# Patient Record
Sex: Male | Born: 1985 | Race: White | Hispanic: No | Marital: Single | Smoking: Never smoker
Health system: Southern US, Community
[De-identification: ages and names within clinical notes are randomized; demographics above are authoritative.]

## PROBLEM LIST (undated history)

## (undated) DIAGNOSIS — I10 Essential (primary) hypertension: Secondary | ICD-10-CM

---

## 2017-03-20 ENCOUNTER — Ambulatory Visit: Payer: Managed Care, Other (non HMO) | Admitting: Podiatry

## 2017-03-30 ENCOUNTER — Encounter: Payer: Self-pay | Admitting: Podiatry

## 2017-03-30 ENCOUNTER — Ambulatory Visit (INDEPENDENT_AMBULATORY_CARE_PROVIDER_SITE_OTHER): Payer: Managed Care, Other (non HMO) | Admitting: Podiatry

## 2017-03-30 VITALS — BP 151/91 | HR 78

## 2017-03-30 DIAGNOSIS — L600 Ingrowing nail: Secondary | ICD-10-CM | POA: Diagnosis not present

## 2017-03-30 DIAGNOSIS — L03032 Cellulitis of left toe: Secondary | ICD-10-CM | POA: Diagnosis not present

## 2017-03-30 MED ORDER — CEPHALEXIN 500 MG PO CAPS: 500.0000 mg | ORAL_CAPSULE | Freq: Two times a day (BID) | ORAL | 0 refills | Status: AC

## 2017-03-30 NOTE — Progress Notes (Signed)
   Subjective:    Patient ID: Derek Lewis, male    DOB: [birth year], 31 y.o.   MRN: [id number]  HPIThis patient presents to the office with chief complain of an ingrown toenail left foot big toe.  He says he has history of ingrown toenail trated by PCP three times over three years.  He says on his last visit he was referred to my office for permanent correction and killing the root.  He says the ingrown nail is painful walking and wearing his shoes.    Review of Systems  All other systems reviewed and are negative.      Objective:   Physical Exam GENERAL APPEARANCE: Alert, conversant. Appropriately groomed. No acute distress.  VASCULAR: Pedal pulses are  palpable at  [facility] and PT bilateral.  Capillary refill time is immediate to all digits,  Normal temperature gradient.  Digital hair growth is present bilateral  NEUROLOGIC: sensation is normal to 5.07 monofilament at 5/5 sites bilateral.  Light touch is intact bilateral, Muscle strength normal.  MUSCULOSKELETAL: acceptable muscle strength, tone and stability bilateral.  Intrinsic muscluature intact bilateral.  Rectus appearance of foot and digits noted bilateral.   DERMATOLOGIC: skin color, texture, and turgor are within normal limits.  No preulcerative lesions or ulcers  are seen, no interdigital maceration noted.  No open lesions present.   No drainage noted.  NAILS  Marked incurvation lateral border left great toe.  Paronychia with granulation tissue lateral border left great toe.         Assessment & Plan:  Paronychia lateral border left great toe.  Ingrown nail  Left hallux.   IE.  Nail surgery.  Treatment options and alternatives discussed.  Recommended permanent phenol matrixectomy and patient agreed.  Left hallux  was prepped with alcohol and a toe block of 3cc of 2% lidocaine plain was administered in a digital toe block. .  The toe was then prepped with betadine solution .  The offending nail border was then excised and matrix  tissue exposed.  Phenol was then applied to the matrix tissue followed by an alcohol wash.  Antibiotic ointment and a dry sterile dressing was applied.  The patient was dispensed instructions for aftercare.  Prescribe Cephalexin 500 mg  # 15.  RTC 1 week.         Helane Gunther DPM

## 2017-04-06 ENCOUNTER — Encounter: Payer: Self-pay | Admitting: Podiatry

## 2017-04-06 ENCOUNTER — Ambulatory Visit (INDEPENDENT_AMBULATORY_CARE_PROVIDER_SITE_OTHER): Payer: Managed Care, Other (non HMO) | Admitting: Podiatry

## 2017-04-06 DIAGNOSIS — Z09 Encounter for follow-up examination after completed treatment for conditions other than malignant neoplasm: Secondary | ICD-10-CM

## 2017-04-06 NOTE — Progress Notes (Signed)
This patient returns to the office following nail surgery one week ago.  The patient says toe has been soaked and bandaged as directed.  There has been improvement of the toe since the surgery has been performed.  There is different pain noted at the surgical site. The patient presents for continued evaluation and treatment.  GENERAL APPEARANCE: Alert, conversant. Appropriately groomed. No acute distress.  VASCULAR: Pedal pulses palpable at  [facility] and PT bilateral.  Capillary refill time is immediate to all digits,  Normal temperature gradient.    NEUROLOGIC: sensation is normal to 5.07 monofilament at 5/5 sites bilateral.  Light touch is intact bilateral, Muscle strength normal.  MUSCULOSKELETAL: acceptable muscle strength, tone and stability bilateral.  Intrinsic muscluature intact bilateral.  Rectus appearance of foot and digits noted bilateral.   DERMATOLOGIC: skin color, texture, and turgor are within normal limits.  No preulcerative lesions or ulcers  are seen, no interdigital maceration noted.   NAILS  There is necrotic tissue along the nail groove  In the absence of redness swelling and pain. Blister noted laterally at the surgical site.  DX  S/p nail surgery  Phenol reaction.  ROV  Home instructions were discussed.  Patient to call the office if there are any questions or concerns.   Helane Gunther DPM

## 2020-10-03 ENCOUNTER — Emergency Department
Admission: EM | Admit: 2020-10-03 | Discharge: 2020-10-03 | Disposition: A | Discharge status: Home / Self Care | Payer: Managed Care, Other (non HMO) | Attending: Emergency Medicine | Admitting: Emergency Medicine

## 2020-10-03 ENCOUNTER — Other Ambulatory Visit: Payer: Self-pay

## 2020-10-03 ENCOUNTER — Encounter: Payer: Self-pay | Admitting: Emergency Medicine

## 2020-10-03 ENCOUNTER — Emergency Department: Payer: Managed Care, Other (non HMO)

## 2020-10-03 DIAGNOSIS — R197 Diarrhea, unspecified: Secondary | ICD-10-CM

## 2020-10-03 DIAGNOSIS — I10 Essential (primary) hypertension: Secondary | ICD-10-CM | POA: Diagnosis not present

## 2020-10-03 DIAGNOSIS — K529 Noninfective gastroenteritis and colitis, unspecified: Secondary | ICD-10-CM

## 2020-10-03 HISTORY — DX: Essential (primary) hypertension: I10

## 2020-10-03 LAB — COMPREHENSIVE METABOLIC PANEL
ALT: 56 U/L — ABNORMAL HIGH (ref 0–44)
AST: 27 U/L (ref 15–41)
Albumin: 3.5 g/dL (ref 3.5–5.0)
Alkaline Phosphatase: 112 U/L (ref 38–126)
Anion gap: 15 (ref 5–15)
BUN: 12 mg/dL (ref 6–20)
CO2: 19 mmol/L — ABNORMAL LOW (ref 22–32)
Calcium: 8.6 mg/dL — ABNORMAL LOW (ref 8.9–10.3)
Chloride: 97 mmol/L — ABNORMAL LOW (ref 98–111)
Creatinine, Ser: 1.24 mg/dL (ref 0.61–1.24)
GFR, Estimated: >60 mL/min (ref >60)
Glucose, Bld: 137 mg/dL — ABNORMAL HIGH (ref 70–99)
Potassium: 3.8 mmol/L (ref 3.5–5.1)
Sodium: 131 mmol/L — ABNORMAL LOW (ref 135–145)
Total Bilirubin: 1.5 mg/dL — ABNORMAL HIGH (ref 0.3–1.2)
Total Protein: 7.7 g/dL (ref 6.5–8.1)

## 2020-10-03 LAB — CBC WITH DIFFERENTIAL/PLATELET
Abs Immature Granulocytes: 0.18 10*3/uL — ABNORMAL HIGH (ref 0.00–0.07)
Basophils Absolute: 0.1 10*3/uL (ref 0.0–0.1)
Basophils Relative: 1 %
Eosinophils Absolute: 0.1 10*3/uL (ref 0.0–0.5)
Eosinophils Relative: 1 %
HCT: 38.8 % — ABNORMAL LOW (ref 39.0–52.0)
Hemoglobin: 13.4 g/dL (ref 13.0–17.0)
Immature Granulocytes: 1 %
Lymphocytes Relative: 13 %
Lymphs Abs: 1.6 10*3/uL (ref 0.7–4.0)
MCH: 29.5 pg (ref 26.0–34.0)
MCHC: 34.5 g/dL (ref 30.0–36.0)
MCV: 85.5 fL (ref 80.0–100.0)
Monocytes Absolute: 1.2 10*3/uL — ABNORMAL HIGH (ref 0.1–1.0)
Monocytes Relative: 9 %
Neutro Abs: 9.6 10*3/uL — ABNORMAL HIGH (ref 1.7–7.7)
Neutrophils Relative %: 75 %
Platelets: 299 10*3/uL (ref 150–400)
RBC: 4.54 MIL/uL (ref 4.22–5.81)
RDW: 11.8 % (ref 11.5–15.5)
WBC: 12.7 10*3/uL — ABNORMAL HIGH (ref 4.0–10.5)
nRBC: 0 % (ref 0.0–0.2)

## 2020-10-03 LAB — LIPASE, BLOOD: Lipase: 25 U/L (ref 11–51)

## 2020-10-03 MED ORDER — IOHEXOL 300 MG/ML  SOLN
125.0000 mL | Freq: Once | INTRAMUSCULAR | Status: AC | PRN
  Administered 2020-10-03: 125 mL via INTRAVENOUS

## 2020-10-03 MED ORDER — LACTATED RINGERS IV BOLUS
1000.0000 mL | Freq: Once | INTRAVENOUS | Status: AC
  Administered 2020-10-03: 1000 mL via INTRAVENOUS

## 2020-10-03 MED ORDER — ONDANSETRON 4 MG PO TBDP: 4.0000 mg | ORAL_TABLET | Freq: Three times a day (TID) | ORAL | 0 refills | Status: AC | PRN

## 2020-10-03 MED ORDER — MORPHINE SULFATE (PF) 4 MG/ML IV SOLN
4.0000 mg | Freq: Once | INTRAVENOUS | Status: AC
  Administered 2020-10-03: 4 mg via INTRAVENOUS
  Filled 2020-10-03: qty 1

## 2020-10-03 MED ORDER — OXYCODONE-ACETAMINOPHEN 5-325 MG PO TABS: 1.0000 | ORAL_TABLET | ORAL | 0 refills | Status: AC | PRN

## 2020-10-03 MED ORDER — IOHEXOL 350 MG/ML SOLN: 125.0000 mL | Freq: Once | INTRAVENOUS | Status: DC | PRN

## 2020-10-03 MED ORDER — LOPERAMIDE HCL 2 MG PO CAPS: 2.0000 mg | ORAL_CAPSULE | ORAL | 0 refills | Status: AC | PRN

## 2020-10-03 MED ORDER — ONDANSETRON HCL 4 MG/2ML IJ SOLN
4.0000 mg | Freq: Once | INTRAMUSCULAR | Status: AC
  Administered 2020-10-03: 4 mg via INTRAVENOUS
  Filled 2020-10-03: qty 2

## 2020-10-03 NOTE — ED Notes (Signed)
Patient transported to CT 

## 2020-10-03 NOTE — ED Notes (Signed)
Pt back from CT

## 2020-10-03 NOTE — ED Provider Notes (Signed)
[facility] Emergency Department Provider Note   ____________________________________________   First MD Initiated Contact with Patient 10/03/20 [id number]     (approximate)  I have reviewed the triage vital signs and the nursing notes.   HISTORY  Chief Complaint Abdominal Pain and Diarrhea    HPI Derek Lewis is a 34 y.o. male with past medical history of hypertension who presents to the ED complaining of abdominal pain and diarrhea.  Patient reports that he initially developed watery diarrhea about 2 weeks ago and it has been constant since then.  Over the past 24 hours, he has been having to go to the bathroom about every 2 hours and passing only very watery stool.  He has occasionally noticed blood when he goes to wipe, but denies any blood in his stool or rectal bleeding.  Over the past couple of days he also has developed pain in the left lower quadrant of his abdomen which is constant and sharp.  He denies any fevers, cough, chest pain, shortness of breath, dysuria, or hematuria.  He was seen by his PCP for this problem about 1 week ago and prescribed Cipro, which he recently completed with no alleviation in his symptoms.  He denies any recent travel.        Past Medical History:  Diagnosis Date  . Hypertension     There are no problems to display for this patient.   History reviewed. No pertinent surgical history.  Prior to Admission medications   Medication Sig Start Date End Date Taking? Authorizing Provider  Apremilast (OTEZLA) 30 MG TABS Take by mouth 2 (two) times daily.    [provider]  cephALEXin (KEFLEX) 500 MG capsule Take 1 capsule (500 mg total) by mouth 2 (two) times daily. 03/30/17   Helane Gunther, DPM  cetirizine (ZYRTEC ALLERGY) 10 MG tablet Take 10 mg by mouth daily.    [provider]  loperamide (IMODIUM) 2 MG capsule Take 1 capsule (2 mg total) by mouth as needed for diarrhea or loose stools. 10/03/20    Chesley Noon, MD  ondansetron (ZOFRAN ODT) 4 MG disintegrating tablet Take 1 tablet (4 mg total) by mouth every 8 (eight) hours as needed for nausea or vomiting. 10/03/20   Chesley Noon, MD  oxyCODONE-acetaminophen (PERCOCET) 5-325 MG tablet Take 1 tablet by mouth every 4 (four) hours as needed for severe pain. 10/03/20 10/03/21  Chesley Noon, MD    Allergies Seasonal ic [cholestatin]  History reviewed. No pertinent family history.  Social History Social History   Tobacco Use  . Smoking status: Never Smoker  . Smokeless tobacco: Never Used  Substance Use Topics  . Alcohol use: Yes    Comment: occas.  . Drug use: No    Review of Systems  Constitutional: No fever/chills Eyes: No visual changes. ENT: No sore throat. Cardiovascular: Denies chest pain. Respiratory: Denies shortness of breath. Gastrointestinal: Positive for abdominal pain and nausea, no vomiting.  Positive for diarrhea.  No constipation. Genitourinary: Negative for dysuria. Musculoskeletal: Negative for back pain. Skin: Negative for rash. Neurological: Negative for headaches, focal weakness or numbness.  ____________________________________________   PHYSICAL EXAM:  VITAL SIGNS: ED Triage Vitals  Enc Vitals Group     BP      Pulse      Resp      Temp      Temp src      SpO2      Weight      Height  Head Circumference      Peak Flow      Pain Score      Pain Loc      Pain Edu?      Excl. in GC?     Constitutional: Alert and oriented. Eyes: Conjunctivae are normal. Head: Atraumatic. Nose: No congestion/rhinnorhea. Mouth/Throat: Mucous membranes are dry. Neck: Normal ROM Cardiovascular: Tachycardic, regular rhythm. Grossly normal heart sounds. Respiratory: Normal respiratory effort.  No retractions. Lungs CTAB. Gastrointestinal: Soft and tender to palpation in the left lower quadrant with no rebound or guarding. No distention. Genitourinary: deferred Musculoskeletal: No lower  extremity tenderness nor edema. Neurologic:  Normal speech and language. No gross focal neurologic deficits are appreciated. Skin:  Skin is warm, dry and intact. No rash noted. Psychiatric: Mood and affect are normal. Speech and behavior are normal.  ____________________________________________   LABS (all labs ordered are listed, but only abnormal results are displayed)  Labs Reviewed  CBC WITH DIFFERENTIAL/PLATELET - Abnormal; Notable for the following components:      Result Value   WBC 12.7 (*)    HCT 38.8 (*)    Neutro Abs 9.6 (*)    Monocytes Absolute 1.2 (*)    Abs Immature Granulocytes 0.18 (*)    All other components within normal limits  COMPREHENSIVE METABOLIC PANEL - Abnormal; Notable for the following components:   Sodium 131 (*)    Chloride 97 (*)    CO2 19 (*)    Glucose, Bld 137 (*)    Calcium 8.6 (*)    ALT 56 (*)    Total Bilirubin 1.5 (*)    All other components within normal limits  GASTROINTESTINAL PANEL BY PCR, STOOL (REPLACES STOOL CULTURE)  C DIFFICILE QUICK SCREEN W PCR REFLEX  LIPASE, BLOOD    PROCEDURES  Procedure(s) performed (including Critical Care):  Procedures   ____________________________________________   INITIAL IMPRESSION / ASSESSMENT AND PLAN / ED COURSE       34 year old male with past medical history of hypertension who presents to the ED complaining of 2 weeks of watery diarrhea, has now developed left lower quadrant abdominal pain and some light bleeding when he goes to wipe.  He has focal left lower quadrant abdominal tenderness on exam and we will perform CT scan to further assess for diverticulitis.  Lab work is pending although patient appears clinically dehydrated and we will give IV fluids along with morphine and Zofran.  Plan perform stool studies to rule out C. difficile or other infectious source of diarrhea.  I doubt significant GI bleeding as he has only noticed a small amount of blood when he wipes and this is  likely due to rectal irritation.  Lab work appears most consistent with dehydration but overall is reassuring.  CT scan shows no evidence of diverticulitis but does show colitis that could be ischemic, infectious, or inflammatory in origin.  No reason to suggest ischemic etiology and infectious seems less likely given he has had symptoms ongoing for 2 weeks.  Would consider inflammatory etiology at this time and patient has follow-up scheduled with GI in 2 days.  We will hold off on antibiotics for now and treat symptomatically with pain medication, Zofran, and loperamide.  He has been unable to provide a stool sample and I have low suspicion for C. difficile at this time.  He is appropriate for discharge home and was counseled to return to the ED for new worsening symptoms, patient agrees with plan.  ____________________________________________   FINAL CLINICAL IMPRESSION(S) / ED DIAGNOSES  Final diagnoses:  Colitis  Diarrhea, unspecified type     ED Discharge Orders         Ordered    oxyCODONE-acetaminophen (PERCOCET) 5-325 MG tablet  Every 4 hours PRN        10/03/20 1043    ondansetron (ZOFRAN ODT) 4 MG disintegrating tablet  Every 8 hours PRN        10/03/20 1043    loperamide (IMODIUM) 2 MG capsule  As needed        10/03/20 1043           Note:  This document was prepared using Dragon voice recognition software and may include unintentional dictation errors.   Chesley Noon, MD 10/03/20 1045

## 2020-10-03 NOTE — ED Triage Notes (Signed)
Pt via POV from home. Pt c/o diarrhea for approx 2 weeks and lower abdominal pain that started on Tuesday. Pt also c/o nausea without vomiting. Pt states he also seeing bright red blood in his stools. Pt is A&Ox4 and NAD

## 2021-05-15 IMAGING — CT CT ABD-PELV W/ CM
2 series · 12 of 38 positions shown, 15 images · IV contrast (APPLIED)
Comparison: None.

CLINICAL DATA: Diarrhea for 2 weeks. Several days of abdominal
pain. Nausea without vomiting. Right red blood in stools.

EXAM:
CT ABDOMEN AND PELVIS WITH CONTRAST
TECHNIQUE: Multidetector CT imaging of the abdomen and pelvis was performed
using the standard protocol following bolus administration of
intravenous contrast.
CONTRAST:  125mL OMNIPAQUE IOHEXOL 300 MG/ML  SOLN

[Series 5: coronal st · coronal · 0.93mm/px · 11 of 111 slices shown, 13 images]
[im 10/111  lung]
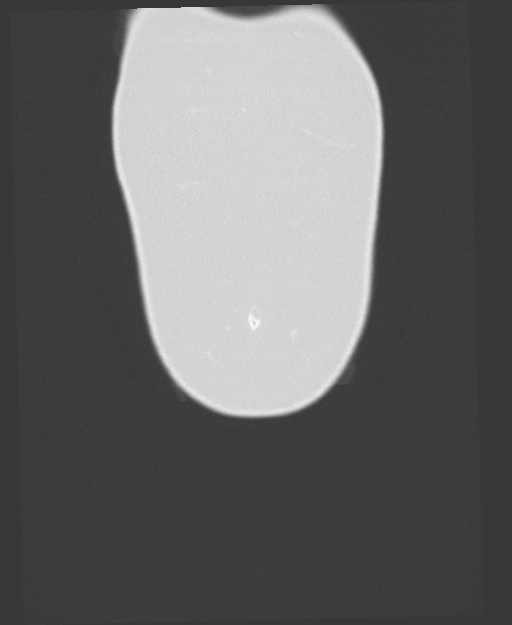
[im 13/111  soft-tissue]
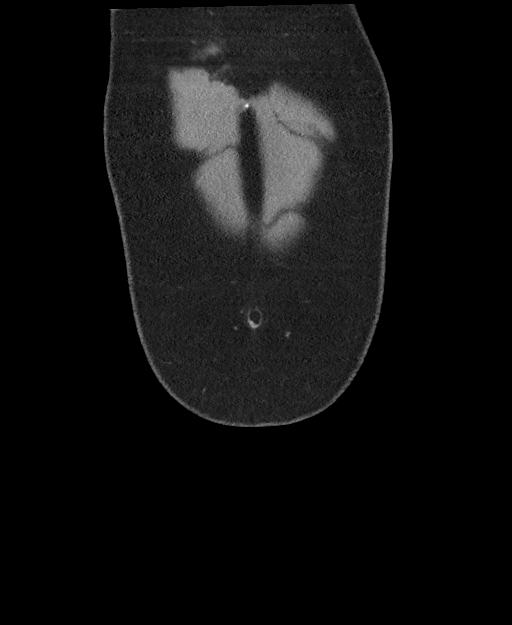
[im 13/111  bone]
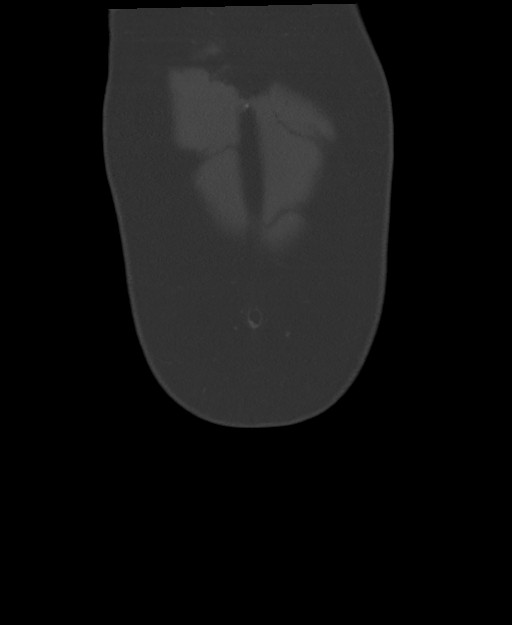
[im 19/111  lung]
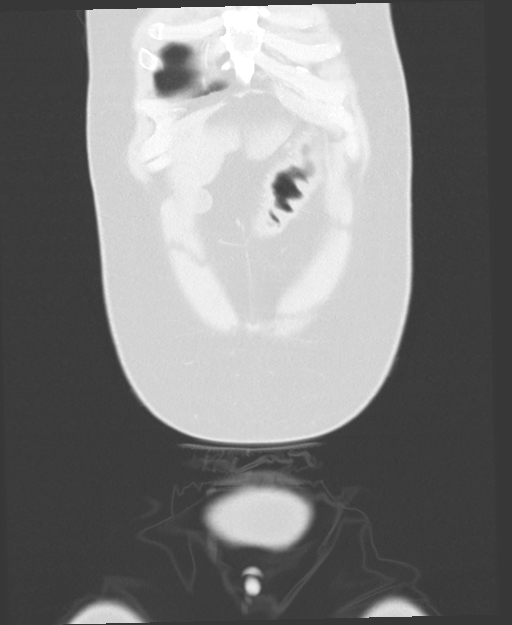
[im 25/111  soft-tissue]
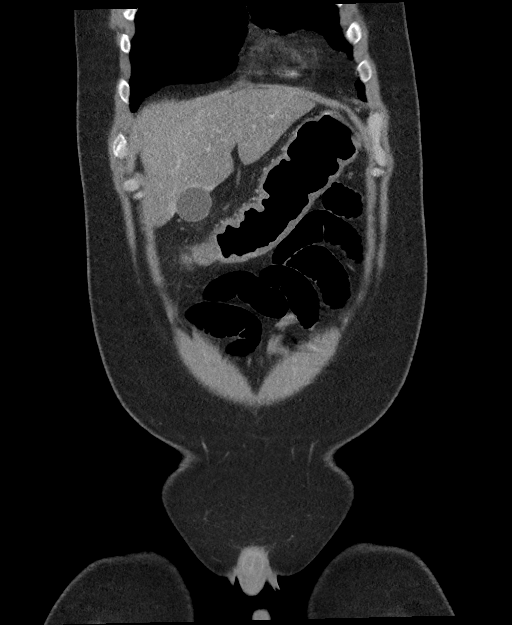
[im 28/111  lung]
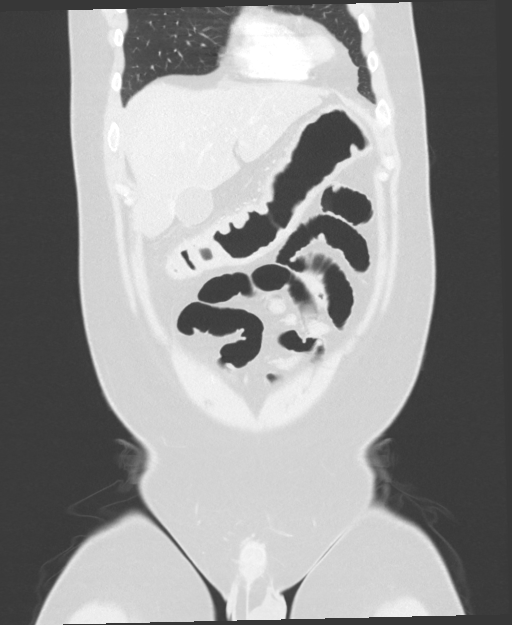
[im 37/111  soft-tissue]
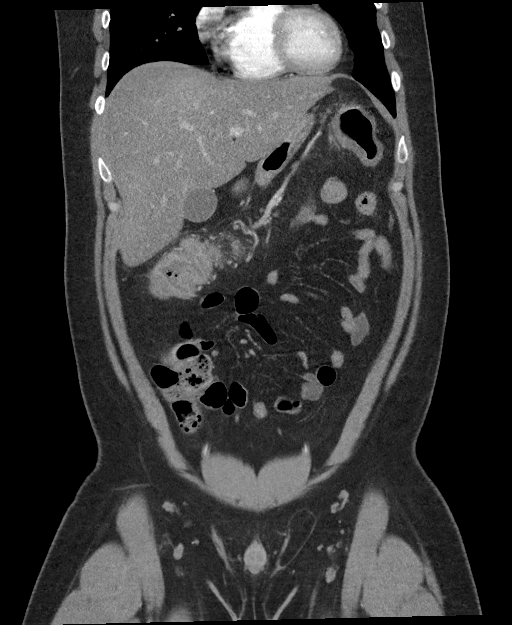
[im 37/111  lung]
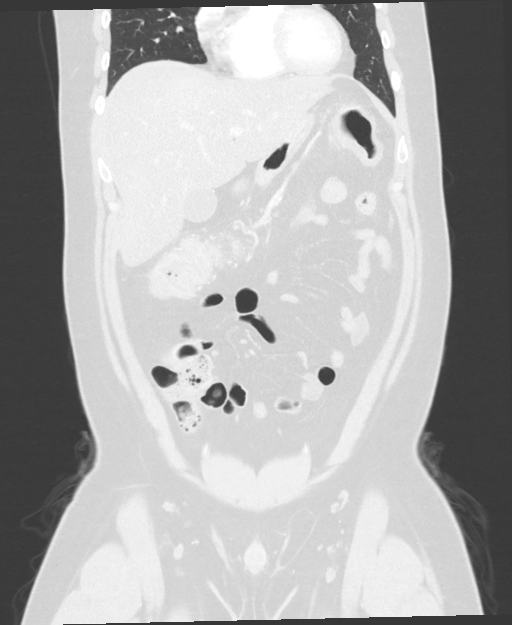
[im 49/111  soft-tissue]
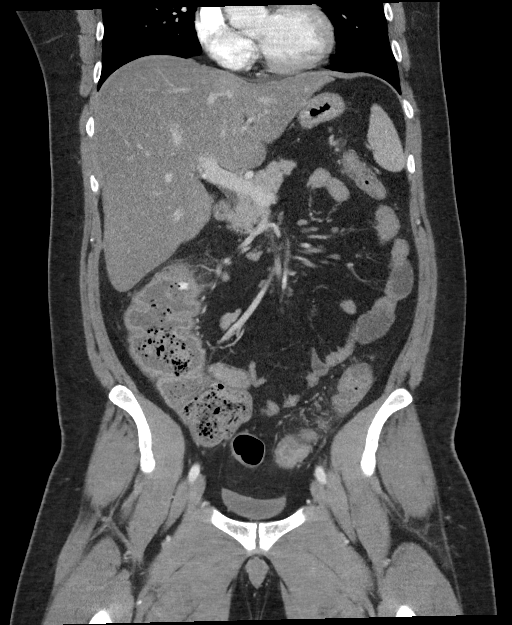
[im 62/111  soft-tissue]
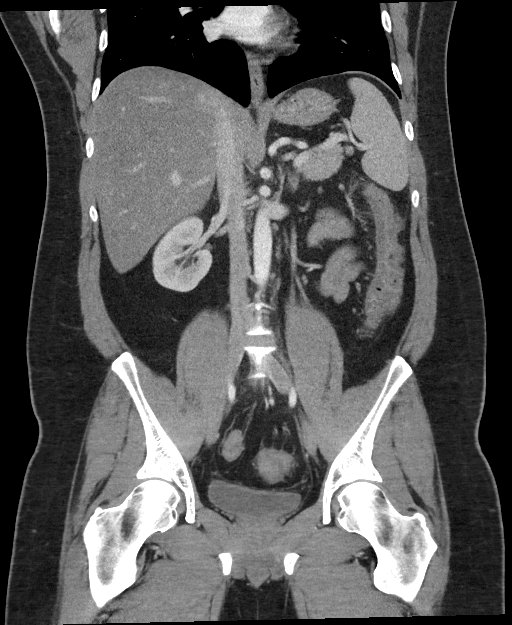
[im 74/111  soft-tissue]
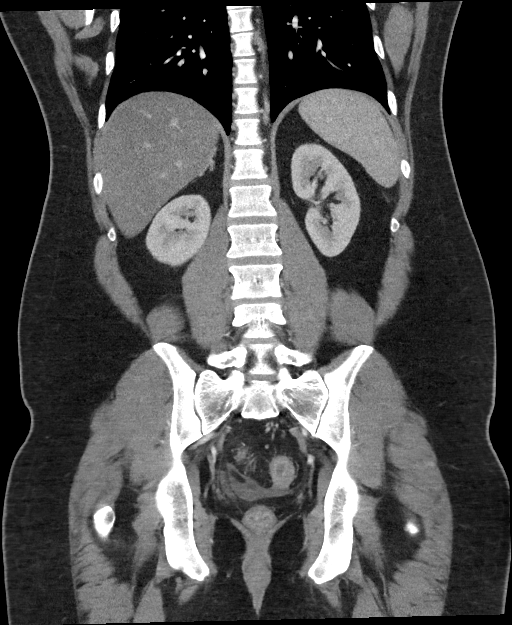
[im 86/111  soft-tissue]
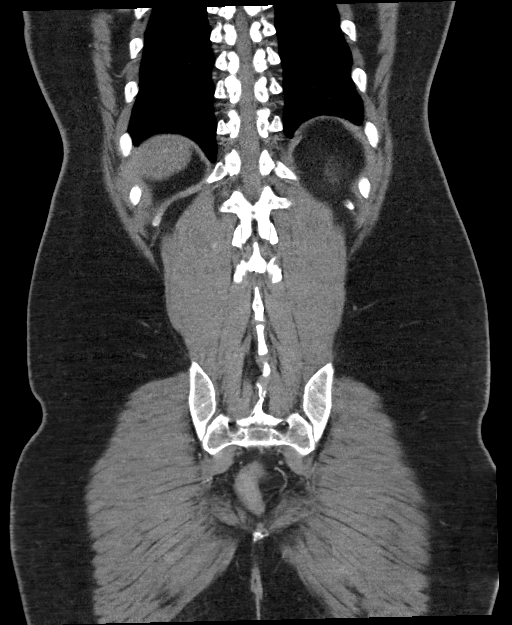
[im 98/111  soft-tissue]
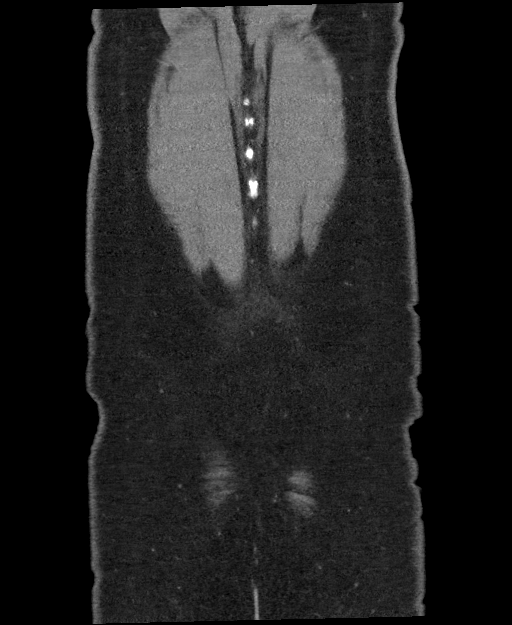

[Series 6: sagittal st · sagittal · 0.65mm/px · 1 of 159 slices shown, 2 images]
[im 80/159  soft-tissue]
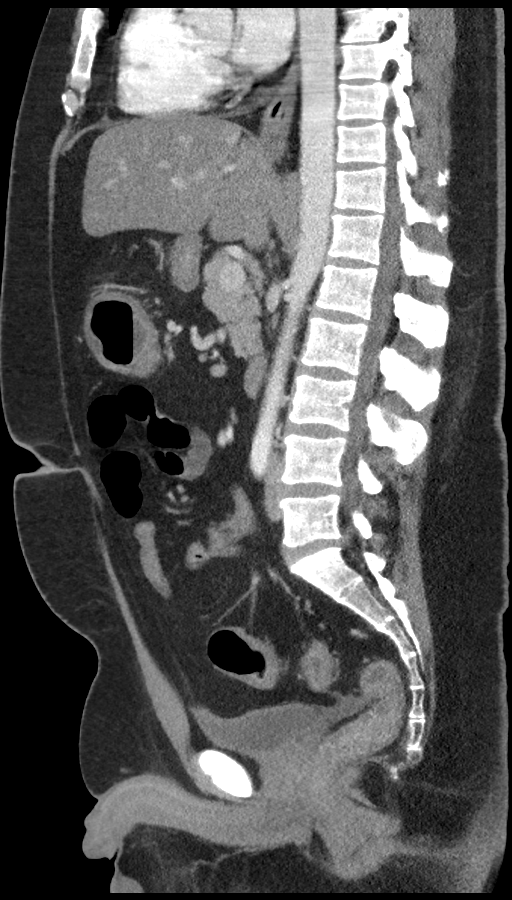
[im 80/159  bone]
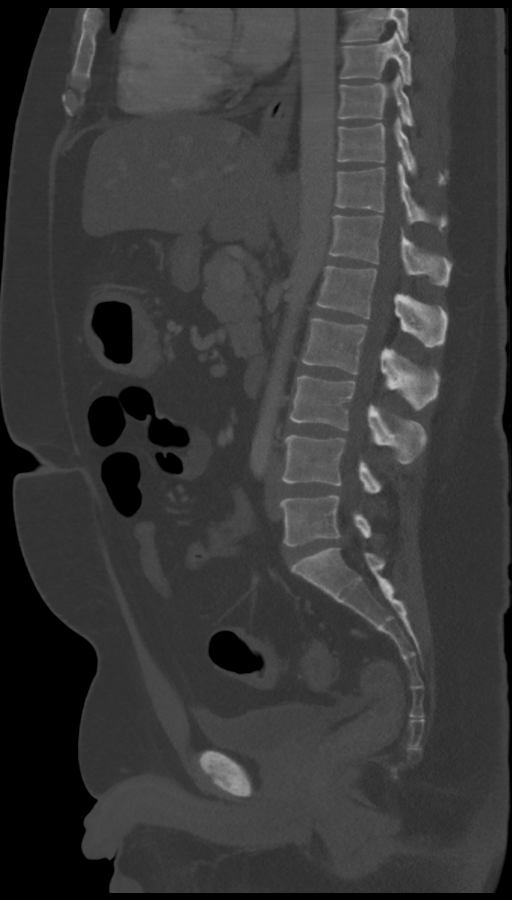

[12 of 38 positions shown; findings below may reference images not displayed]

FINDINGS: Lower chest: No acute abnormality.

Hepatobiliary: Hepatic steatosis is identified with no suspicious
hepatic masses. Focal sparing is seen adjacent to the gallbladder.
Gallbladder is unremarkable. The portal vein is normal.

Pancreas: Unremarkable. No pancreatic ductal dilatation or
surrounding inflammatory changes.

Spleen: Normal in size without focal abnormality.

Adrenals/Urinary Tract: The adrenal glands are normal. No renal
stones or obstruction. A probable cyst is seen in the anterior left
kidney on series 2, image 38, too small to characterize. The ureters
and bladder are within normal limits.

Stomach/Bowel: The stomach is within normal limits. The small bowel
is unremarkable. Diffuse wall thickening is associated with the
colon from cecum through rectum with pericolonic fat stranding. No
pneumatosis is identified. No diverticuli are noted. The appendix is
normal in appearance.

Vascular/Lymphatic: No significant vascular findings are present. No
enlarged abdominal or pelvic lymph nodes.

Reproductive: Prostate is unremarkable.

Other: There is a small amount of fluid in the pelvis, likely
reactive. No free air. No other acute abnormalities.

Musculoskeletal: No acute or significant osseous findings.
IMPRESSION: 1. Diffuse colonic wall thickening with pericolonic fat stranding
consistent with colitis. The colitis could be inflammatory,
infectious, or ischemic. Inflammatory causes such as Crohn's disease
or ulcerative colitis should be considered.
2. Hepatic steatosis.
3. No other acute abnormalities.

## 2021-10-11 ENCOUNTER — Other Ambulatory Visit: Payer: Self-pay

## 2021-10-11 ENCOUNTER — Ambulatory Visit
Admission: RE | Admit: 2021-10-11 | Discharge: 2021-10-11 | Disposition: A | Discharge status: Home / Self Care | Payer: Managed Care, Other (non HMO) | Source: Ambulatory Visit

## 2021-10-11 ENCOUNTER — Ambulatory Visit
Admission: RE | Admit: 2021-10-11 | Discharge: 2021-10-11 | Disposition: A | Discharge status: Home / Self Care | Payer: Managed Care, Other (non HMO) | Attending: Gastroenterology | Admitting: Gastroenterology

## 2021-10-11 ENCOUNTER — Other Ambulatory Visit: Payer: Self-pay | Admitting: Gastroenterology

## 2021-10-11 DIAGNOSIS — M545 Low back pain, unspecified: Secondary | ICD-10-CM | POA: Insufficient documentation

## 2022-05-23 IMAGING — CR DG LUMBAR SPINE 2-3V
3 series · 3 of 3 positions shown · non-contrast
Comparison: None.

CLINICAL DATA: acute low back pain. Pt has been having low back
pain x 1 year after being sick with covid and colon bursting during
that time. States he has colostomy as they removed his total colon

EXAM:
LUMBAR SPINE - 2-3 VIEW

[l-spine ap]
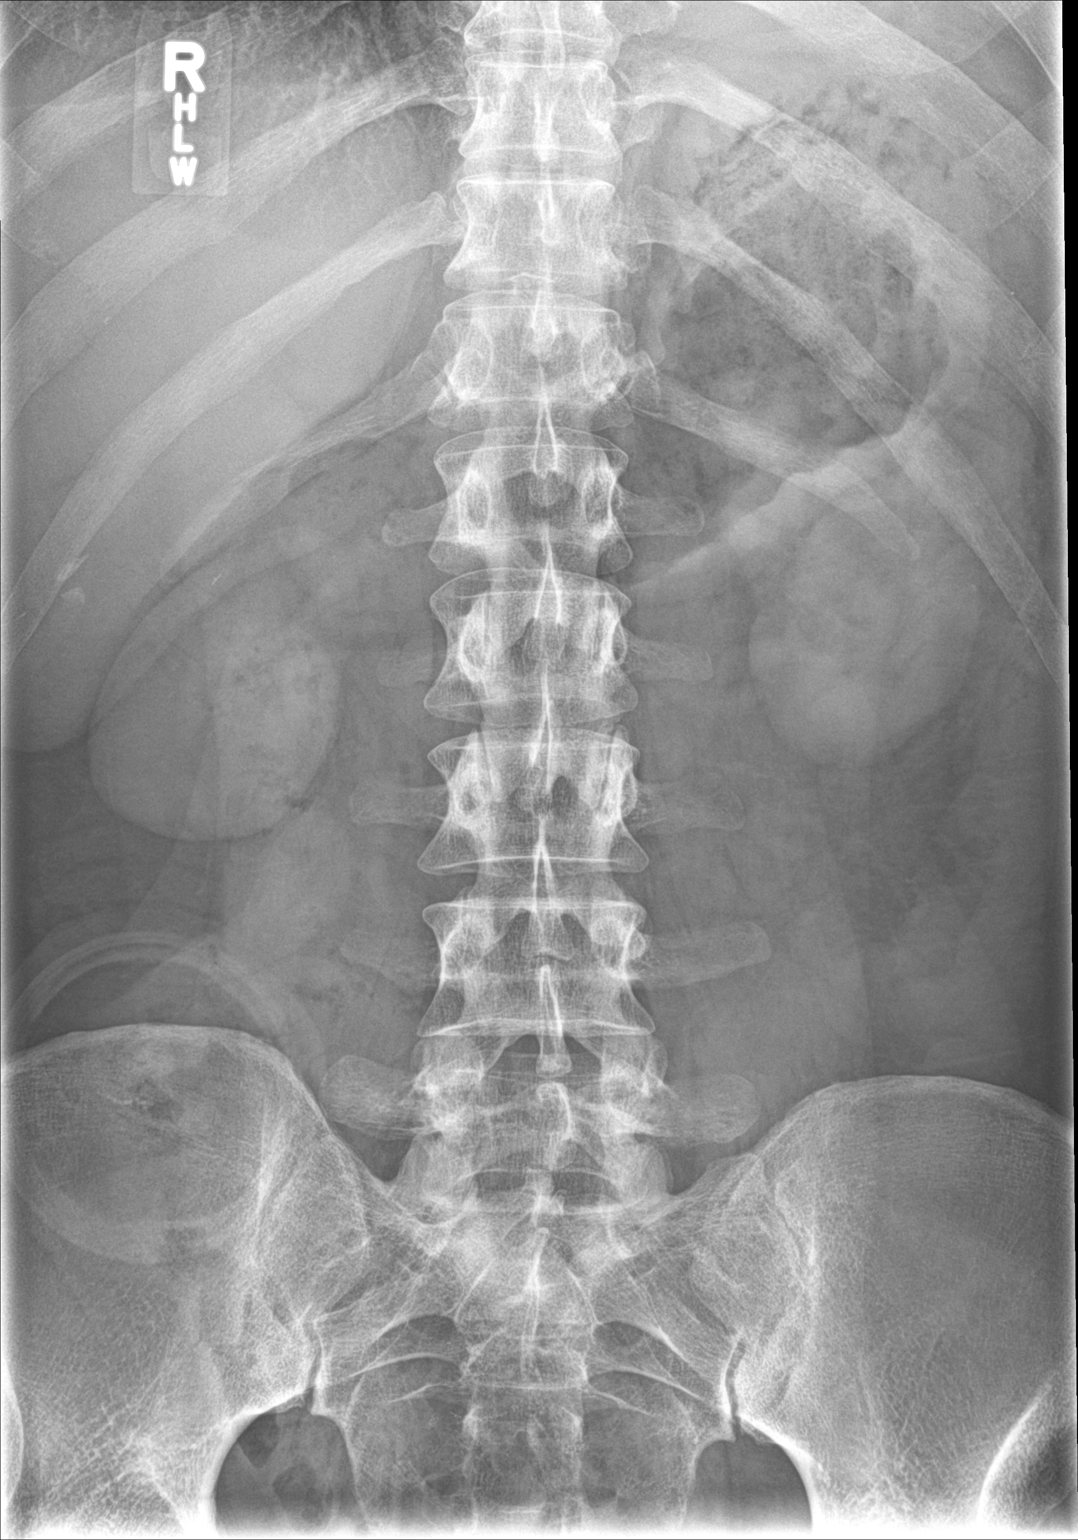

[l-spine lat]
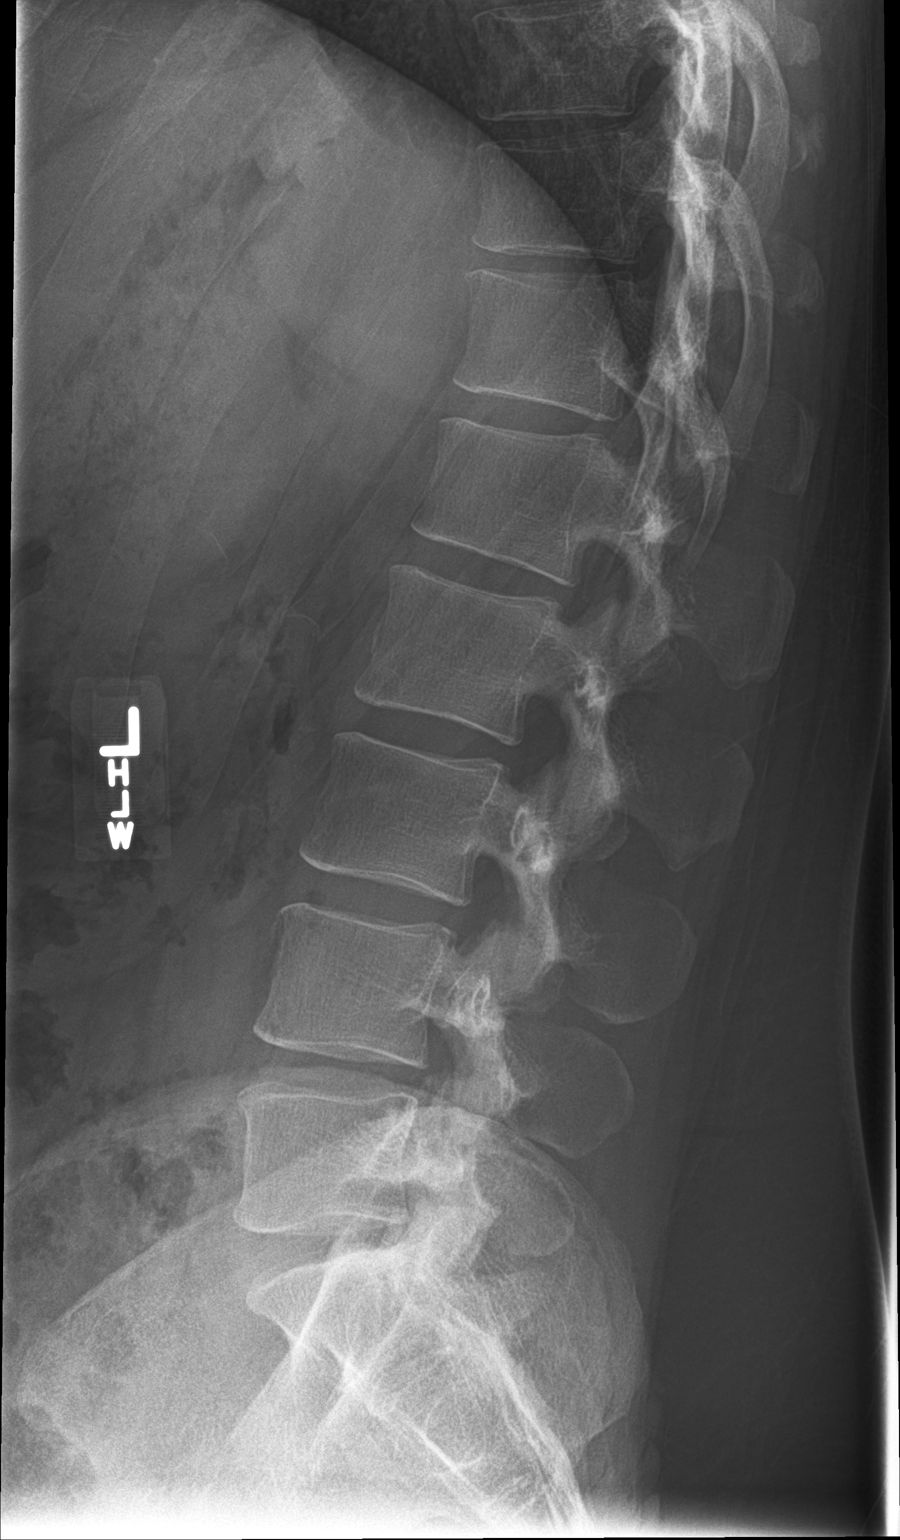

[l-spine spot]
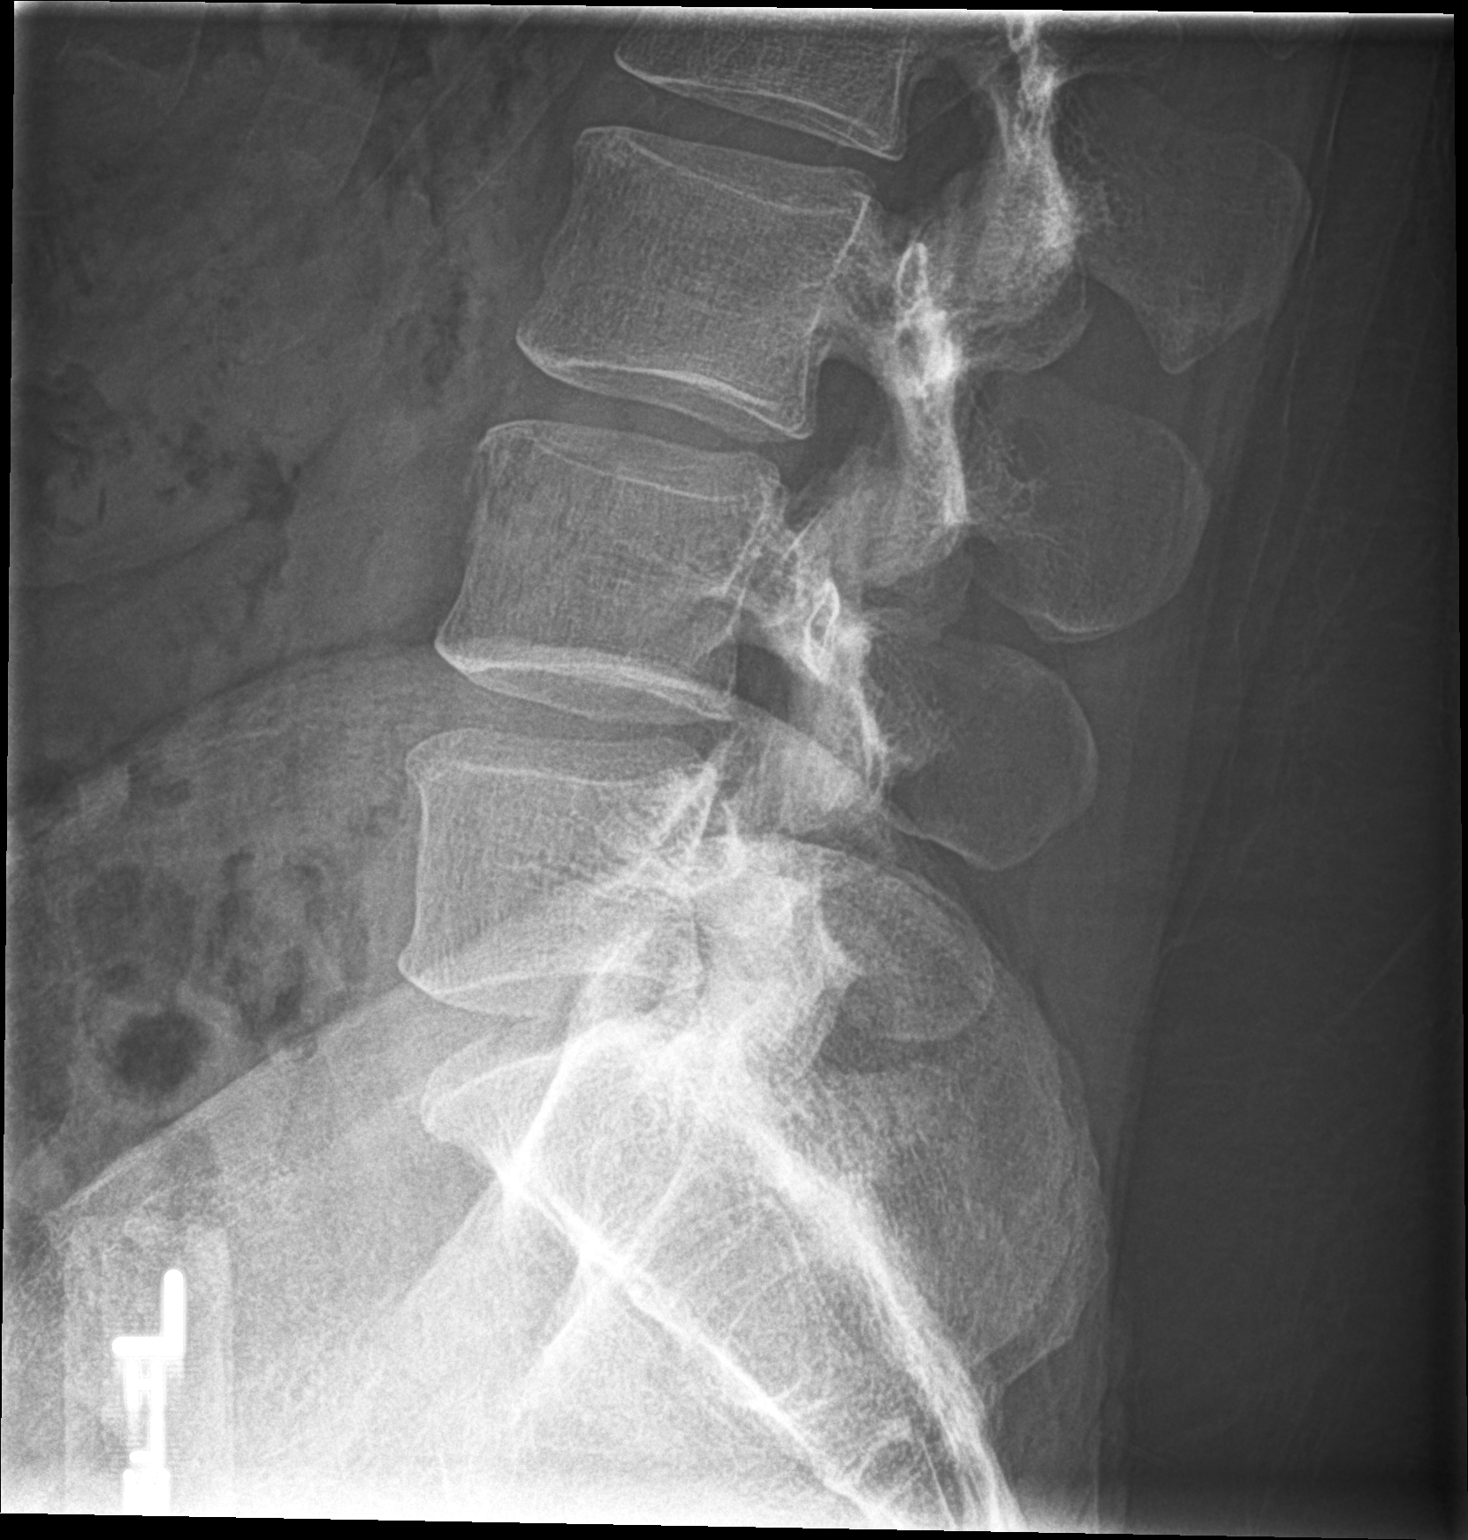

[3 of 3 positions shown; findings below may reference images not displayed]

FINDINGS: Markedly limited evaluation due to overlapping osseous structures
and overlying soft tissues.

Five non-rib-bearing lumbar vertebral bodies.

There is no evidence of lumbar spine fracture. Alignment is normal.
Intervertebral disc spaces are maintained.
IMPRESSION: No acute displaced fracture or traumatic listhesis of the lumbar
spine. Markedly limited evaluation due to overlapping osseous
structures and overlying soft tissues.

## 2023-08-30 ENCOUNTER — Other Ambulatory Visit: Payer: Self-pay | Admitting: Family Medicine

## 2023-08-30 DIAGNOSIS — M25512 Pain in left shoulder: Secondary | ICD-10-CM
